# Patient Record
Sex: Male | Born: 1985 | Race: White | Hispanic: No | Marital: Married | Smoking: Current every day smoker
Health system: Southern US, Community
[De-identification: ages and names within clinical notes are randomized; demographics above are authoritative.]

---

## 2003-01-05 ENCOUNTER — Ambulatory Visit (HOSPITAL_COMMUNITY): Admission: RE | Admit: 2003-01-05 | Discharge: 2003-01-05 | Payer: Self-pay | Admitting: Internal Medicine

## 2003-01-22 ENCOUNTER — Encounter: Payer: Self-pay | Admitting: Internal Medicine

## 2003-01-22 ENCOUNTER — Encounter (HOSPITAL_COMMUNITY): Admission: RE | Admit: 2003-01-22 | Discharge: 2003-02-21 | Payer: Self-pay | Admitting: Internal Medicine

## 2003-01-28 ENCOUNTER — Encounter: Payer: Self-pay | Admitting: Internal Medicine

## 2003-02-03 ENCOUNTER — Encounter: Payer: Self-pay | Admitting: Internal Medicine

## 2003-02-16 ENCOUNTER — Ambulatory Visit (HOSPITAL_COMMUNITY): Admission: RE | Admit: 2003-02-16 | Discharge: 2003-02-16 | Payer: Self-pay | Admitting: General Surgery

## 2010-02-22 ENCOUNTER — Ambulatory Visit: Payer: Self-pay | Admitting: Diagnostic Radiology

## 2010-02-22 ENCOUNTER — Emergency Department (HOSPITAL_BASED_OUTPATIENT_CLINIC_OR_DEPARTMENT_OTHER): Admission: EM | Admit: 2010-02-22 | Discharge: 2010-02-23 | Payer: Self-pay | Admitting: Emergency Medicine
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
## 2011-03-03 NOTE — H&P (Signed)
   NAME:  Charles Meadows                           ACCOUNT NO.:  [id number]   MEDICAL RECORD NO.:  [id number]                   PATIENT TYPE:  AMB   LOCATION:  DAY                                  FACILITY:  [facility]   PHYSICIAN:  Dalia Heading, M.D.               DATE OF BIRTH:  1986-09-21   DATE OF ADMISSION:  01/05/2003  DATE OF DISCHARGE:  01/05/2003                                HISTORY & PHYSICAL   CHIEF COMPLAINT:  Chronic cholecystitis.   HISTORY OF PRESENT ILLNESS:  The patient is a 25 year old white male who was  referred for evaluation and treatment of biliary colic secondary to chronic  cholecystitis.  He has been having right upper quadrant abdominal pain with  radiation to the right flank and back, nausea, postprandial symptoms, fatty  food intolerance, indigestion for many months.  No fever, chills, or  jaundice have been noted.  The symptoms seem to be worsening.  An extensive  workup by Dr. Jena Gauss reveals no specific etiology.  His symptoms are  reproducible after hepatobiliary scan and drinking half and half solution.   PAST MEDICAL HISTORY:  As noted above.   PAST SURGICAL HISTORY:  Unremarkable.   CURRENT MEDICATIONS:  1. Protonix.  2. Phenergan p.r.n.   ALLERGIES:  No known drug allergies.   REVIEW OF SYSTEMS:  Noncontributory.   PHYSICAL EXAMINATION:  GENERAL:  The patient is a well-developed, well-  nourished white male in no acute distress.  VITAL SIGNS:  He is afebrile and vital signs are stable.  HEENT:  Reveals no scleral icterus.  LUNGS:  Clear to auscultation with equal breath sounds bilaterally.  HEART:  Reveals regular rate and rhythm without S3, S4, or murmurs.  ABDOMEN:  Soft, nondistended.  He is tender in the right upper quadrant to  palpation.  No hepatosplenomegaly, masses, or hernias are identified.   IMPRESSION:  Biliary colic.  Chronic cholecystitis.    PLAN:  The patient is scheduled for laparoscopic cholecystectomy on Feb 16, 2003.   The risks and benefits of the procedure, including bleeding,  infection, hepatobiliary injury, and the possibility of an open procedure  were fully explained to the patient.  He gave informed consent.                                               Dalia Heading, M.D.    MAJ/MEDQ  D:  02/12/2003  T:  02/12/2003  Job:  [id number]   cc:   Gaynelle Cage, MD  [phone] [address]  [place]  [place] [address]  Fax: [phone]

## 2011-03-03 NOTE — Op Note (Signed)
NAME:  Charles Meadows                           ACCOUNT NO.:  [id number]   MEDICAL RECORD NO.:  [id number]                   PATIENT TYPE:  AMB   LOCATION:  DAY                                  FACILITY:  [facility]   PHYSICIAN:  Dalia Heading, M.D.               DATE OF BIRTH:  1986-06-06   DATE OF PROCEDURE:  DATE OF DISCHARGE:                                 OPERATIVE REPORT   PREOPERATIVE DIAGNOSIS:  Chronic cholecystitis.   POSTOPERATIVE DIAGNOSIS:  Chronic cholecystitis.   PROCEDURE:  Laparoscopic cholecystectomy   SURGEON:  Dalia Heading, M.D.   ANESTHESIA:  General endotracheal   INDICATIONS:  The patient is a 25 year old white male who is referred for  evaluation and treatment of biliary colic secondary to chronic  cholecystitis.  The risks and benefits of the procedure including bleeding,  infection, hepatobiliary injury, and the possibility of an open procedure  were fully explained to the patient, who gave informed consent.   DESCRIPTION OF PROCEDURE:  The patient was placed in the supine position.  After induction of general endotracheal anesthesia, the abdomen was prepped  and draped using the usual sterile technique with Betadine.  Surgical site  confirmation was performed.   A supraumbilical incision was made down to the fascia.  A Veress needle was  introduced into the abdominal cavity and confirmation of placement was done  using the saline drop test.  The abdomen was then insufflated to 16 mmHg  pressure.  An 11-mm trocar was introduced into the abdominal cavity under  direct visualization without difficulty.  The patient was placed then in  reverse Trendelenburg position and an additional 11-mm trocar was placed in  the epigastric region and 5-mm trocars were placed in the right upper  quadrant and right flank regions. The liver was inspected and noted to be  within normal limits.  The gallbladder was retracted superiorly and  laterally.  The dissection was  begun around the infundibulum of the  gallbladder.  The cystic duct was first identified.  Its junction to the  infundibulum fully identified.  Endoclips were placed proximally and  distally on the cystic duct; and the cystic duct was divided.  This was  likewise done on the cystic artery.  The gallbladder was then freed away  from the gallbladder fossa using Bovie electrocautery.  The gallbladder was  delivered through the epigastric trocar site without difficulty.  The  gallbladder fossa was inspected and no abnormal bleeding or bile leakage was  noted.  Surgicel was placed in the gallbladder fossa, the subhepatic space,  as well as the right hepatic gutter were evacuated of all fluid and air  prior to removal of the trocars.   All wounds were irrigated with normal saline.  All wounds were injected with  0.5% Sensorcaine.  The supraumbilical fascia was reapproximated using an #0  Vicryl interrupted suture. All skin incisions  were closed using staples.  Betadine ointment and dry sterile dressings were applied.   All tape and needle counts were correct at the end of the procedure.  The  patient was extubated in the operating room and went back to recovery room  awake and stable condition.   COMPLICATIONS:  None.   SPECIMENS:  Gallbladder.   BLOOD LOSS:  Minimal.                                               Dalia Heading, M.D.    MAJ/MEDQ  D:  02/16/2003  T:  02/16/2003  Job:  [id number]   cc:   R. Roetta Sessions, M.D.  [address]  [place]  [place] [address]  Fax: [phone]   Gaynelle Cage, MD  [phone] [address]  [place]  [place] [address]  Fax: [phone]

## 2011-03-03 NOTE — Consult Note (Signed)
NAME:  Charles Meadows                             ACCOUNT NO.:  [id number]   MEDICAL RECORD NO.:  [id number]                   PATIENT TYPE:  AMB   LOCATION:                                       FACILITY:  [facility]   PHYSICIAN:  R. Roetta Sessions, M.D.              DATE OF BIRTH:  04-14-1986   DATE OF CONSULTATION:  01/02/2003  DATE OF DISCHARGE:                                   CONSULTATION   REASON FOR CONSULTATION:  Nausea, vomiting, and abdominal pain  postprandially.   HISTORY OF PRESENT ILLNESS:  The patient is an 25 year old, Caucasian, male  patient of Dr. Reola Calkins, who presents today for further evaluation of the above-  stated symptoms.  He has had nausea, vomiting, and epigastric pain for two  weeks' duration.  He has been admitted to the hospital on one occasion at  [facility].  Blood work at that time revealed a slightly  elevated white count at 11.6 on admission on December 23, 2002.  The hemoglobin  and hematocrit were normal.  CT of the pelvis was done to rule out  appendicitis.  There was no abnormality seen.  He had an acute abdominal  series, which was normal.  It was felt that abdominal pain, nausea,  vomiting, and diarrhea may be secondary to a viral illness.  The patient has  also been to the emergency department on one other occasion, but was  released.  He had blood work done on December 29, 2002, which revealed a normal  white count of 51, hemoglobin 16.3, hematocrit 50.2, and platelets 185,000.  He had a gallbladder ultrasound on December 30, 2002, which was unremarkable.  LFTs on December 30, 2002, were normal.   The patient states that for the last two weeks he has vomited anything that  he eats or drinks.  He is having epigastric pain after eating.  Prior to the  last couple of weeks he generally had a bowel movement daily.  However, over  the last two weeks, he has only had two to three bowel movements, although  he is eating very little.  He denies any  melena or rectal bleeding.  He has  chronic heartburn as long as he can remember.  He has been having  significant heartburn symptoms, but unfortunately has not been able to keep  his Protonix down.  He denies any dysphagia or odynophagia.  He has used [facility]  Powders chronically for headache.   CURRENT MEDICATIONS:  1. Phenergan 25 mg p.r.n.  2. Protonix 40 mg daily.  3. BC Powders p.r.n.   ALLERGIES:  No known drug allergies.   PAST MEDICAL HISTORY:  Negative for chronic illnesses.   FAMILY HISTORY:  Negative for chronic GI illnesses or colorectal cancer.   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  He  consumes alcohol occasionally.  Denies any tobacco use.   REVIEW OF SYSTEMS:  GASTROINTESTINAL:  Please see the HPI.  GENERAL:  He  states that he has lost a couple of pounds.  CARDIOPULMONARY:  Denies any  chest pain or shortness of breath.   PHYSICAL EXAMINATION:  WEIGHT:  234 pounds.  HEIGHT:  6 feet.  VITAL SIGNS:  Temperature 98.3 degrees, blood pressure 104/72, pulse 70.  GENERAL APPEARANCE:  A pleasant, well-nourished, well-developed, Caucasian  male in no acute distress.  He is accompanied by his mother.  SKIN:  Warm and dry.  No jaundice.  HEENT:  Pupils equal, round, and reactive to light.  The conjunctivae are  pink.  Sclerae nonicteric.  Oropharyngeal mucosa moist and pink.  No  lesions, erythema, or exudates.  NECK:  No lymphadenopathy or thyromegaly.  CHEST:  Lungs clear to auscultation.  CARDIAC:  Regular rate and rhythm.  Normal S1 and S2.  No murmurs, rubs, or  gallops.  ABDOMEN:  Positive bowel sounds.  Soft and nondistended.  He has mild to  moderate tenderness in the epigastric region to deep palpation.  Mild  tenderness just right of the midline in the upper abdomen.  No organomegaly  or masses.  No rebound, tenderness, or guarding.  EXTREMITIES:  No edema.   LABORATORY DATA:  On December 29, 2002, glucose 63, BUN 10,  creatinine 0.9,  total bilirubin 0.5, alkaline phosphatase 65, SGOT 18, SGPT 20, and albumin  4.4.   IMPRESSION:  The patient is an 25 year old gentleman with a two-week history  of postprandial nausea, vomiting, and epigastric pain.  He is keeping very  little food or liquids down.  He clinically does not appear to be  dehydrated.  In addition, his BUN and creatinine recently were normal.  Given chronic BC Powders use, would be concerned about peptic ulcer disease  and partial gastric outlet obstruction.  In addition, he also has chronic  gastrointestinal reflux disease and may be having symptoms partially due to  poorly controlled reflux.  He needs to have an EGD for further evaluation of  these symptoms.   PLAN:  1. EGD in the near future.  2. He will discontinue all NSAID and aspirin use, including BC Powders.  3. Protonix 40 mg p.o. b.i.d., #30 samples given.  4. He will continue Phenergan p.r.n.  I offered suppositories, but he     refused.  5. Further recommendations to follow.   I would like to thank Dr. Reola Calkins for allowing [place] to take part in the care of  this patient.     Tana Coast, Pricilla Larsson, M.D.    LL/MEDQ  D:  01/02/2003  T:  01/02/2003  Job:  [id number]

## 2011-03-03 NOTE — Op Note (Signed)
NAME:  Charles Meadows                           ACCOUNT NO.:  [id number]   MEDICAL RECORD NO.:  [id number]                   PATIENT TYPE:  AMB   LOCATION:  DAY                                  FACILITY:  [facility]   PHYSICIAN:  R. Roetta Sessions, M.D.              DATE OF BIRTH:  02-12-1986   DATE OF PROCEDURE:  01/05/2003  DATE OF DISCHARGE:                                 OPERATIVE REPORT   PROCEDURE:  Esophagogastroduodenoscopy diagnostic.   INDICATIONS FOR PROCEDURE:  The patient is an 25 year old gentleman with  recent post prandial vomiting, epigastric pain and reflux symptoms recently  admitted to [facility]. A CT scan was negative. He had been on  Protonix and NSAID use had been discontinued. Protonix was increased to 40  mg orally b.i.d. three days ago. EGD is now being done to further evaluate  his symptoms. This approach has been discussed with the patient at length  previously and again today at the bedside. The potential risks, benefits,  and alternatives have been reviewed. Please see the dictated H&P and  consultation noted for more information.   MONITORING:  O2 saturation, blood pressure, pulse, and respirations were  monitored throughout the entire procedure.   CONSCIOUS SEDATION:  Versed 6 mg IV, Demerol 100 mg IV in divided doses.  Cetacaine spray for topical oropharyngeal anesthesia.   INSTRUMENTS:  Olympus video chip adult gastroscope.   FINDINGS:  Examination of the tubular esophagus revealed four quadrant  distal esophageal erosions coming up as 3 cm into the distal esophagus.  There was free reflux of gastric juice seen during the exam. The EG junction  was patulous. There was on Barrett's esophagus or other abnormality. Pylorus  easily traversed.   STOMACH:  The gastric cavity was empty and insufflated well with air.  A  thorough examination of the gastric mucosa including a retroflexed view of  the proximal stomach and esophagogastric junction  demonstrated only a small  hiatal hernia.  The pylorus was patent and easily traversed.   DUODENUM:  The bulb and second portion appeared normal.   THERAPEUTIC/DIAGNOSTIC MANEUVERS:  None.   The patient tolerated the procedure well and was reacted in endoscopy.   IMPRESSION:  1. Multiple distal esophageal erosions consistent with mild to moderately     severe erosive reflux esophagitis. Patulous esophagogastric junction.  2. Hiatal hernia. The remainder of the stomach and duodenum through the     second portion appeared normal.  3. The patient has complicated reflux disease.    RECOMMENDATIONS:  1. Antireflux measures/literature provided to [name].  2. Continue Protonix 40 mg orally b.i.d.  3. Will see him back in the office in 3-4 weeks.  [name], M.D.    RMR/MEDQ  D:  01/05/2003  T:  01/05/2003  Job:  [id number]

## 2013-12-06 ENCOUNTER — Emergency Department (HOSPITAL_COMMUNITY): Payer: BC Managed Care – PPO

## 2013-12-06 ENCOUNTER — Emergency Department (HOSPITAL_COMMUNITY)
Admission: EM | Admit: 2013-12-06 | Discharge: 2013-12-06 | Disposition: A | Discharge status: Home / Self Care | Payer: BC Managed Care – PPO | Attending: Emergency Medicine | Admitting: Emergency Medicine

## 2013-12-06 ENCOUNTER — Encounter (HOSPITAL_COMMUNITY): Payer: Self-pay | Admitting: Emergency Medicine

## 2013-12-06 DIAGNOSIS — S41111A Laceration without foreign body of right upper arm, initial encounter: Secondary | ICD-10-CM

## 2013-12-06 DIAGNOSIS — S21209A Unspecified open wound of unspecified back wall of thorax without penetration into thoracic cavity, initial encounter: Secondary | ICD-10-CM | POA: Insufficient documentation

## 2013-12-06 DIAGNOSIS — F17200 Nicotine dependence, unspecified, uncomplicated: Secondary | ICD-10-CM | POA: Insufficient documentation

## 2013-12-06 DIAGNOSIS — S41109A Unspecified open wound of unspecified upper arm, initial encounter: Secondary | ICD-10-CM | POA: Insufficient documentation

## 2013-12-06 DIAGNOSIS — S21219A Laceration without foreign body of unspecified back wall of thorax without penetration into thoracic cavity, initial encounter: Secondary | ICD-10-CM

## 2013-12-06 LAB — SAMPLE TO BLOOD BANK

## 2013-12-06 LAB — COMPREHENSIVE METABOLIC PANEL
ALBUMIN: 3.9 g/dL (ref 3.5–5.2)
ALT: 29 U/L (ref 0–53)
AST: 25 U/L (ref 0–37)
Alkaline Phosphatase: 95 U/L (ref 39–117)
BUN: 14 mg/dL (ref 6–23)
CALCIUM: 9.4 mg/dL (ref 8.4–10.5)
CO2: 25 meq/L (ref 19–32)
CREATININE: 1.02 mg/dL (ref 0.50–1.35)
Chloride: 106 mEq/L (ref 96–112)
GFR calc Af Amer: >90 mL/min (ref >90)
GFR calc non Af Amer: >90 mL/min (ref >90)
Glucose, Bld: 134 mg/dL — ABNORMAL HIGH (ref 70–99)
Potassium: 4.1 mEq/L (ref 3.7–5.3)
SODIUM: 143 meq/L (ref 137–147)
TOTAL PROTEIN: 7.1 g/dL (ref 6.0–8.3)
Total Bilirubin: 0.4 mg/dL (ref 0.3–1.2)

## 2013-12-06 LAB — CBC
HEMATOCRIT: 47.1 % (ref 39.0–52.0)
HEMOGLOBIN: 15.9 g/dL (ref 13.0–17.0)
MCH: 30.3 pg (ref 26.0–34.0)
MCHC: 33.8 g/dL (ref 30.0–36.0)
MCV: 89.9 fL (ref 78.0–100.0)
Platelets: 196 10*3/uL (ref 150–400)
RBC: 5.24 MIL/uL (ref 4.22–5.81)
RDW: 12.7 % (ref 11.5–15.5)
WBC: 9.2 10*3/uL (ref 4.0–10.5)

## 2013-12-06 MED ORDER — OXYCODONE-ACETAMINOPHEN 5-325 MG PO TABS
2.0000 | ORAL_TABLET | Freq: Once | ORAL | Status: AC
  Administered 2013-12-06: 2 via ORAL
  Filled 2013-12-06: qty 2

## 2013-12-06 MED ORDER — HYDROCODONE-ACETAMINOPHEN 5-325 MG PO TABS: 1.0000 | ORAL_TABLET | Freq: Four times a day (QID) | ORAL | Status: AC | PRN
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
## 2013-12-06 NOTE — ED Notes (Signed)
PA at bedside performing suture care. Pt tolerating without difficulty. 

## 2013-12-06 NOTE — Discharge Instructions (Signed)

## 2013-12-06 NOTE — ED Notes (Signed)
EDP at bedside performing suture care. Pt tolerating without difficulty. Vital signs stable. Family remains at bedside.
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
## 2013-12-06 NOTE — ED Notes (Signed)
CSI at bedside.

## 2013-12-06 NOTE — ED Provider Notes (Signed)
Medical screening examination/treatment/procedure(s) were conducted as a shared visit with non-physician practitioner(s) and myself.  I personally evaluated the patient during the encounter.  EKG Interpretation   None         Shanna CiscoMegan E Docherty, MD 12/06/13 [phone]

## 2013-12-06 NOTE — ED Provider Notes (Signed)
LACERATION REPAIR Performed by: Antony MaduraHUMES, Linzee Depaul Authorized by: Antony MaduraHUMES, Ross Bender Consent: Verbal consent obtained. Risks and benefits: risks, benefits and alternatives were discussed Consent given by: patient Patient identity confirmed: provided demographic data Prepped and Draped in normal sterile fashion Wound explored  Laceration Location: proximal R forearm  Laceration Length: 5cm  No Foreign Bodies seen or palpated  Anesthesia: local infiltration  Local anesthetic: lidocaine 2% with epinephrine  Anesthetic total: 4.5 ml  Irrigation method: syringe Amount of cleaning: standard  Skin closure: 4-0 chromic  Number of sutures: 3  Technique: subcutaneous  Patient tolerance: Patient tolerated the procedure well with no immediate complications.  LACERATION REPAIR Performed by: Antony MaduraHUMES, Simuel Stebner Authorized by: Antony MaduraHUMES, Lileigh Fahringer Consent: Verbal consent obtained. Risks and benefits: risks, benefits and alternatives were discussed Consent given by: patient Patient identity confirmed: provided demographic data Prepped and Draped in normal sterile fashion Wound explored  Laceration Location: proximal R forearm  Laceration Length: 5cm  No Foreign Bodies seen or palpated  Anesthesia: local infiltration  Local anesthetic: lidocaine 2% with epinephrine  Anesthetic total: 4.5 ml  Irrigation method: syringe Amount of cleaning: standard  Skin closure: 5-0 prolene  Number of sutures: 7 sutures  Technique: 3 horizontal mattress; 4 simple interrupted  Patient tolerance: Patient tolerated the procedure well with no immediate complications.  LACERATION REPAIR Performed by: Antony MaduraHUMES, Crystalyn Delia Authorized by: Antony MaduraHUMES, Sandeep Radell Consent: Verbal consent obtained. Risks and benefits: risks, benefits and alternatives were discussed Consent given by: patient Patient identity confirmed: provided demographic data Prepped and Draped in normal sterile fashion Wound explored  Laceration Location: distal  R forearm  Laceration Length: 6cm  No Foreign Bodies seen or palpated  Anesthesia: local infiltration  Local anesthetic: lidocaine 2% with epinephrine  Anesthetic total: 4.5 ml  Irrigation method: syringe Amount of cleaning: standard  Skin closure: 4-0 chromic  Number of sutures: 3  Technique: subcutaneous  Patient tolerance: Patient tolerated the procedure well with no immediate complications.  LACERATION REPAIR Performed by: Antony MaduraHUMES, Romello Hoehn Authorized by: Antony MaduraHUMES, Kerensa Nicklas Consent: Verbal consent obtained. Risks and benefits: risks, benefits and alternatives were discussed Consent given by: patient Patient identity confirmed: provided demographic data Prepped and Draped in normal sterile fashion Wound explored  Laceration Location: distal R forearm  Laceration Length: 6cm  No Foreign Bodies seen or palpated  Anesthesia: local infiltration  Local anesthetic: lidocaine 2% with epinephrine  Anesthetic total: 4.5 ml  Irrigation method: syringe Amount of cleaning: standard  Skin closure: 5-0 prolene  Number of sutures: 9 sutures  Technique: 5 horizontal mattress; 4 simple interrupted  Patient tolerance: Patient tolerated the procedure well with no immediate complications.   Antony MaduraKelly Cadence Haslam, PA-C 12/06/13 [phone]

## 2013-12-06 NOTE — ED Notes (Signed)
EDP at bedside discussing plan of care with patient and family. Pt remains alert and oriented x4.
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
## 2016-01-27 DIAGNOSIS — H6982 Other specified disorders of Eustachian tube, left ear: Secondary | ICD-10-CM | POA: Diagnosis not present

## 2016-07-25 DIAGNOSIS — N50811 Right testicular pain: Secondary | ICD-10-CM | POA: Diagnosis not present

## 2016-07-25 DIAGNOSIS — N433 Hydrocele, unspecified: Secondary | ICD-10-CM | POA: Diagnosis not present

## 2016-08-22 DIAGNOSIS — Z1389 Encounter for screening for other disorder: Secondary | ICD-10-CM | POA: Diagnosis not present

## 2016-08-22 DIAGNOSIS — Z720 Tobacco use: Secondary | ICD-10-CM | POA: Diagnosis not present

## 2016-08-22 DIAGNOSIS — Z0000 Encounter for general adult medical examination without abnormal findings: Secondary | ICD-10-CM | POA: Diagnosis not present

## 2017-08-29 DIAGNOSIS — Z1331 Encounter for screening for depression: Secondary | ICD-10-CM | POA: Diagnosis not present

## 2017-08-29 DIAGNOSIS — Z0000 Encounter for general adult medical examination without abnormal findings: Secondary | ICD-10-CM | POA: Diagnosis not present

## 2017-08-29 DIAGNOSIS — Z6841 Body Mass Index (BMI) 40.0 and over, adult: Secondary | ICD-10-CM | POA: Diagnosis not present

## 2017-08-29 DIAGNOSIS — Z1339 Encounter for screening examination for other mental health and behavioral disorders: Secondary | ICD-10-CM | POA: Diagnosis not present

## 2018-01-03 DIAGNOSIS — J209 Acute bronchitis, unspecified: Secondary | ICD-10-CM | POA: Diagnosis not present

## 2018-01-03 DIAGNOSIS — J0100 Acute maxillary sinusitis, unspecified: Secondary | ICD-10-CM | POA: Diagnosis not present

## 2018-01-03 DIAGNOSIS — R509 Fever, unspecified: Secondary | ICD-10-CM | POA: Diagnosis not present

## 2018-01-03 DIAGNOSIS — J111 Influenza due to unidentified influenza virus with other respiratory manifestations: Secondary | ICD-10-CM | POA: Diagnosis not present

## 2018-03-28 DIAGNOSIS — L720 Epidermal cyst: Secondary | ICD-10-CM | POA: Diagnosis not present

## 2018-04-08 DIAGNOSIS — L720 Epidermal cyst: Secondary | ICD-10-CM | POA: Diagnosis not present

## 2018-07-22 DIAGNOSIS — M9902 Segmental and somatic dysfunction of thoracic region: Secondary | ICD-10-CM | POA: Diagnosis not present

## 2018-07-22 DIAGNOSIS — M546 Pain in thoracic spine: Secondary | ICD-10-CM | POA: Diagnosis not present

## 2018-07-23 DIAGNOSIS — Z13220 Encounter for screening for lipoid disorders: Secondary | ICD-10-CM | POA: Diagnosis not present

## 2018-07-23 DIAGNOSIS — Z6837 Body mass index (BMI) 37.0-37.9, adult: Secondary | ICD-10-CM | POA: Diagnosis not present

## 2018-07-23 DIAGNOSIS — Z0000 Encounter for general adult medical examination without abnormal findings: Secondary | ICD-10-CM | POA: Diagnosis not present

## 2018-08-26 DIAGNOSIS — M9902 Segmental and somatic dysfunction of thoracic region: Secondary | ICD-10-CM | POA: Diagnosis not present

## 2018-08-26 DIAGNOSIS — M546 Pain in thoracic spine: Secondary | ICD-10-CM | POA: Diagnosis not present

## 2018-08-29 DIAGNOSIS — M9902 Segmental and somatic dysfunction of thoracic region: Secondary | ICD-10-CM | POA: Diagnosis not present

## 2018-08-29 DIAGNOSIS — M546 Pain in thoracic spine: Secondary | ICD-10-CM | POA: Diagnosis not present

## 2018-12-25 DIAGNOSIS — J0110 Acute frontal sinusitis, unspecified: Secondary | ICD-10-CM | POA: Diagnosis not present

## 2019-02-26 DIAGNOSIS — Z03818 Encounter for observation for suspected exposure to other biological agents ruled out: Secondary | ICD-10-CM | POA: Diagnosis not present

## 2019-08-05 DIAGNOSIS — Z6832 Body mass index (BMI) 32.0-32.9, adult: Secondary | ICD-10-CM | POA: Diagnosis not present

## 2019-08-05 DIAGNOSIS — Z79899 Other long term (current) drug therapy: Secondary | ICD-10-CM | POA: Diagnosis not present

## 2019-08-05 DIAGNOSIS — Z1331 Encounter for screening for depression: Secondary | ICD-10-CM | POA: Diagnosis not present

## 2019-08-05 DIAGNOSIS — Z0000 Encounter for general adult medical examination without abnormal findings: Secondary | ICD-10-CM | POA: Diagnosis not present

## 2019-08-05 DIAGNOSIS — Z23 Encounter for immunization: Secondary | ICD-10-CM | POA: Diagnosis not present

## 2019-11-12 DIAGNOSIS — Z20828 Contact with and (suspected) exposure to other viral communicable diseases: Secondary | ICD-10-CM | POA: Diagnosis not present

## 2019-11-12 DIAGNOSIS — J00 Acute nasopharyngitis [common cold]: Secondary | ICD-10-CM | POA: Diagnosis not present

## 2019-11-17 DIAGNOSIS — Z20828 Contact with and (suspected) exposure to other viral communicable diseases: Secondary | ICD-10-CM | POA: Diagnosis not present

## 2019-11-17 DIAGNOSIS — R430 Anosmia: Secondary | ICD-10-CM | POA: Diagnosis not present

## 2020-06-07 DIAGNOSIS — Z20828 Contact with and (suspected) exposure to other viral communicable diseases: Secondary | ICD-10-CM | POA: Diagnosis not present

## 2020-06-07 DIAGNOSIS — R430 Anosmia: Secondary | ICD-10-CM | POA: Diagnosis not present

## 2020-08-12 DIAGNOSIS — Z13220 Encounter for screening for lipoid disorders: Secondary | ICD-10-CM | POA: Diagnosis not present

## 2020-08-12 DIAGNOSIS — Z23 Encounter for immunization: Secondary | ICD-10-CM | POA: Diagnosis not present

## 2020-08-12 DIAGNOSIS — Z0000 Encounter for general adult medical examination without abnormal findings: Secondary | ICD-10-CM | POA: Diagnosis not present

## 2020-08-12 DIAGNOSIS — Z6839 Body mass index (BMI) 39.0-39.9, adult: Secondary | ICD-10-CM | POA: Diagnosis not present

## 2020-08-12 DIAGNOSIS — Z1331 Encounter for screening for depression: Secondary | ICD-10-CM | POA: Diagnosis not present

## 2020-11-30 ENCOUNTER — Other Ambulatory Visit: Payer: Self-pay | Admitting: Family Medicine

## 2020-11-30 DIAGNOSIS — S8001XA Contusion of right knee, initial encounter: Secondary | ICD-10-CM

## 2020-12-20 ENCOUNTER — Ambulatory Visit
Admission: RE | Admit: 2020-12-20 | Discharge: 2020-12-20 | Disposition: A | Discharge status: Home / Self Care | Payer: Self-pay | Source: Ambulatory Visit | Attending: *Deleted | Admitting: *Deleted

## 2020-12-20 ENCOUNTER — Other Ambulatory Visit: Payer: Self-pay

## 2020-12-20 DIAGNOSIS — S8001XA Contusion of right knee, initial encounter: Secondary | ICD-10-CM
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
## 2022-02-21 ENCOUNTER — Other Ambulatory Visit: Payer: Self-pay | Admitting: Orthopedic Surgery

## 2022-02-21 DIAGNOSIS — M542 Cervicalgia: Secondary | ICD-10-CM

## 2022-03-05 ENCOUNTER — Ambulatory Visit
Admission: RE | Admit: 2022-03-05 | Discharge: 2022-03-05 | Disposition: A | Discharge status: Home / Self Care | Payer: Commercial Managed Care - PPO | Source: Ambulatory Visit | Attending: Orthopedic Surgery | Admitting: Orthopedic Surgery

## 2022-03-05 DIAGNOSIS — M542 Cervicalgia: Secondary | ICD-10-CM

## 2022-03-05 IMAGING — MR MR CERVICAL SPINE W/O CM
5 series · 29 of 48 positions shown · non-contrast
Comparison: None Available.

CLINICAL DATA: Right-sided neck pain.  Right arm pain and numbness.

EXAM:
MRI CERVICAL SPINE WITHOUT CONTRAST
TECHNIQUE: Multiplanar, multisequence MR imaging of the cervical spine was
performed. No intravenous contrast was administered.

[Series 5: T2 · sagittal · 3.0mm · 0.55mm/px · 6 of 16 slices shown (1 of 2)]
[im 1/16]
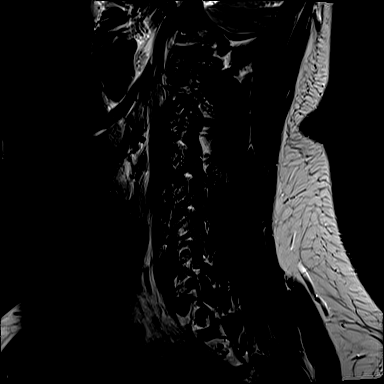
[im 4/16]
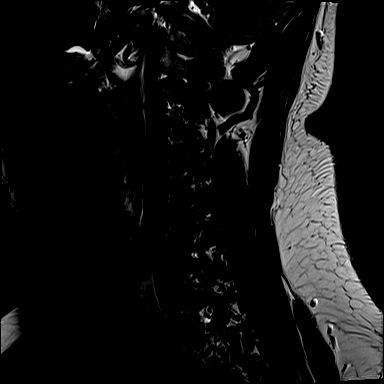
[im 7/16]
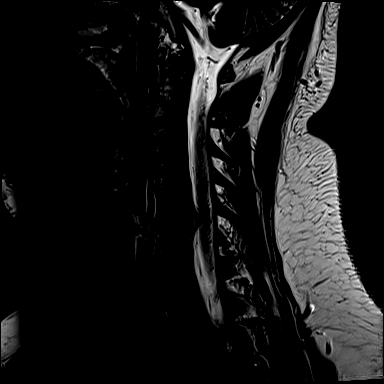
[im 10/16]
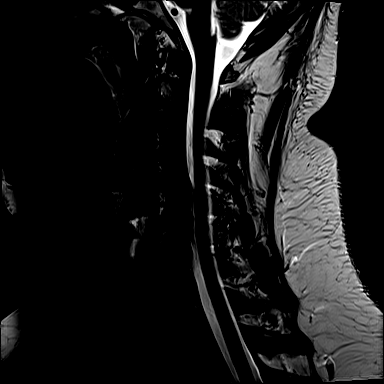
[im 13/16]
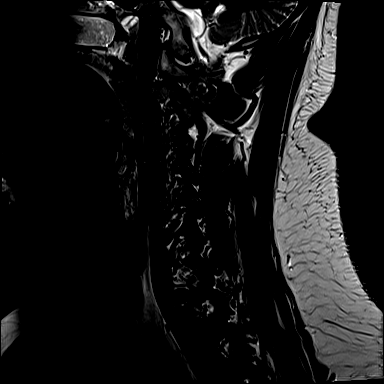
[im 16/16]
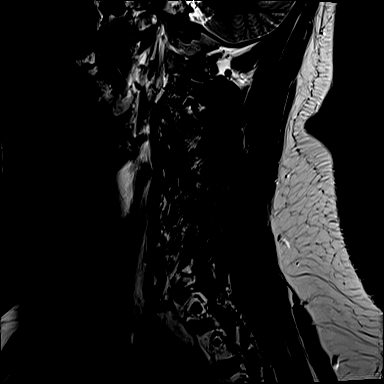

[Series 7: T1 · sagittal · 3.0mm · 0.66mm/px · 6 of 16 slices shown]
[im 1/16]
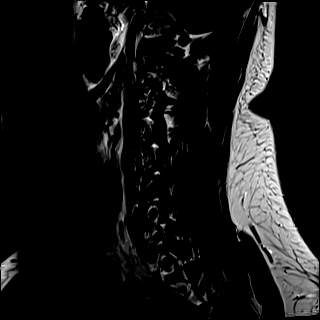
[im 4/16]
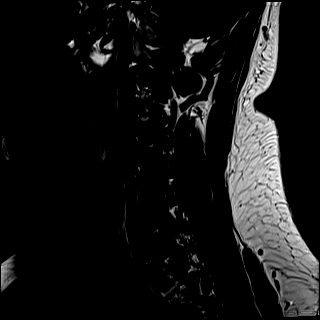
[im 7/16]
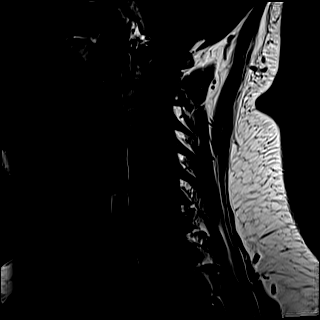
[im 10/16]
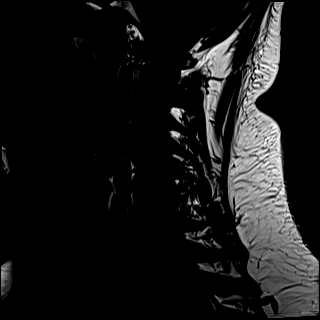
[im 13/16]
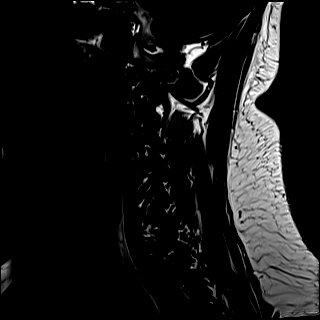
[im 16/16]
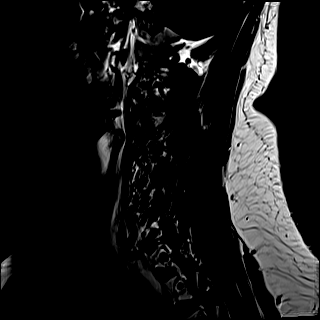

[Series 8: STIR · sagittal · 3.0mm · 0.33mm/px · 6 of 16 slices shown]
[im 1/16]
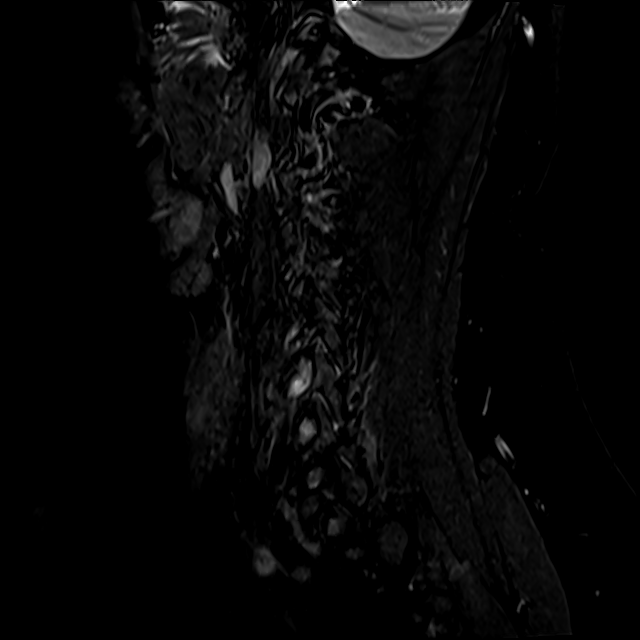
[im 4/16]
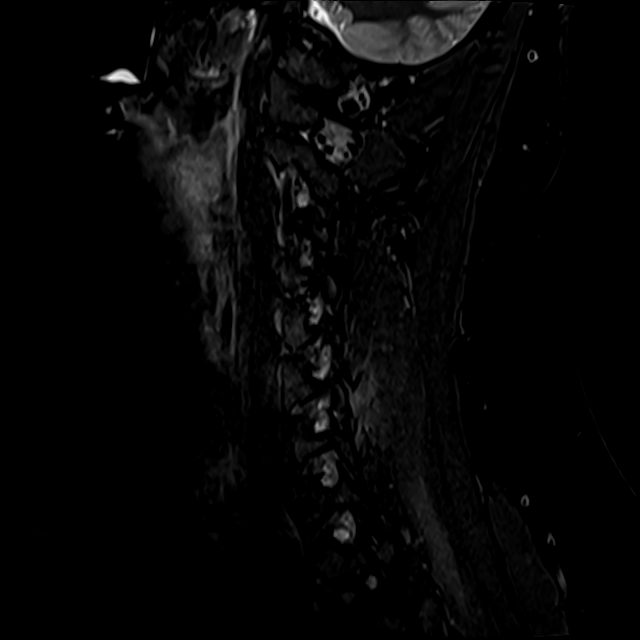
[im 7/16]
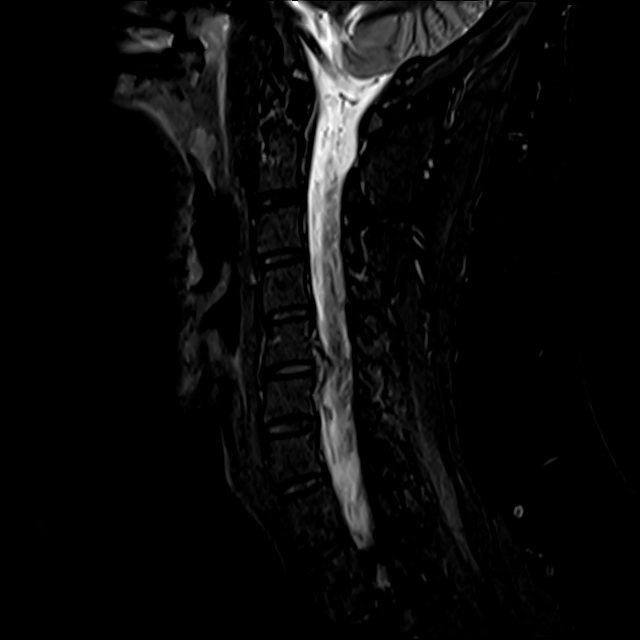
[im 10/16]
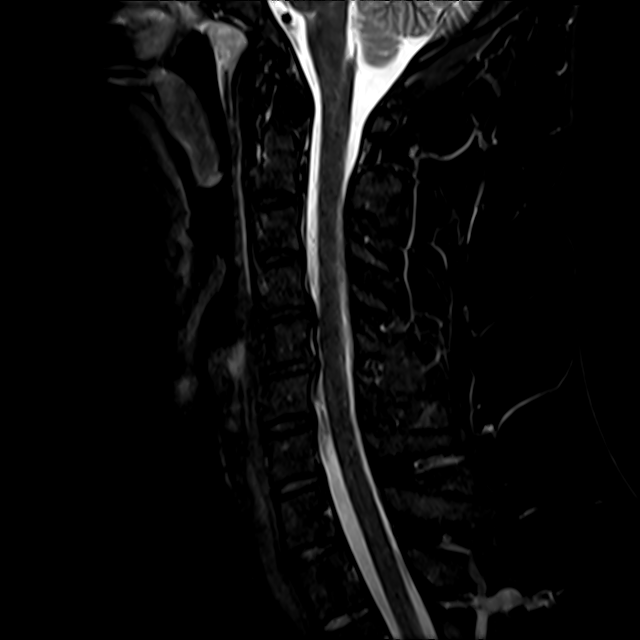
[im 13/16]
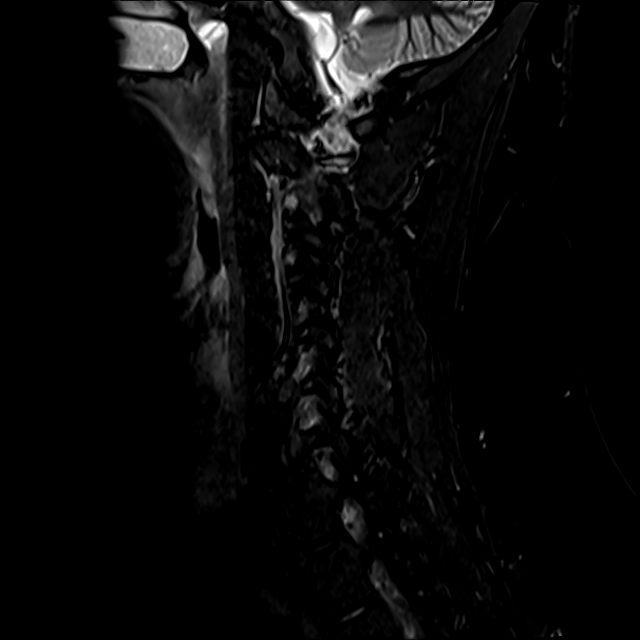
[im 16/16]
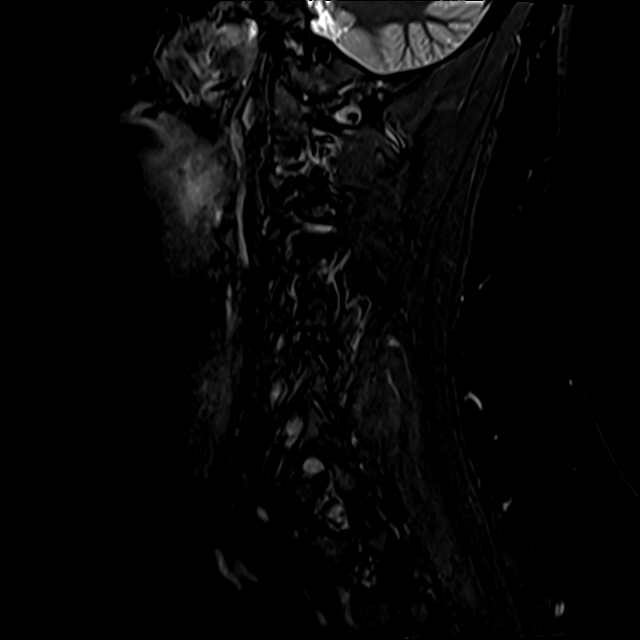

[Series 9: T2 · axial · 3.0mm · 0.56mm/px · z∈[-52,+82]mm · 9 of 42 slices shown (2 of 2)]
[im 1/42]
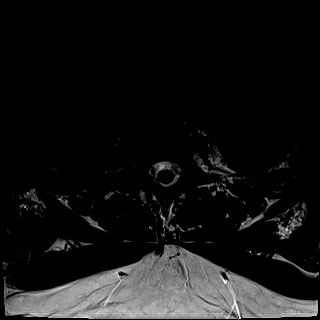
[im 6/42]
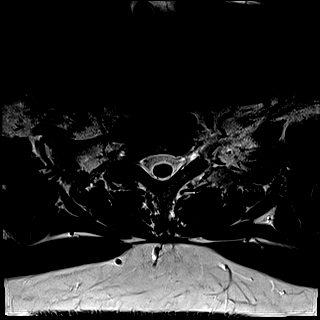
[im 12/42]
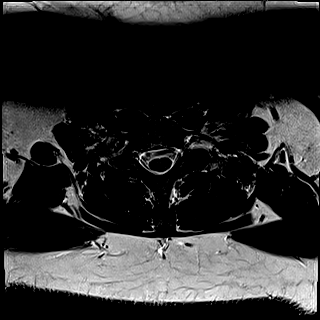
[im 18/42]
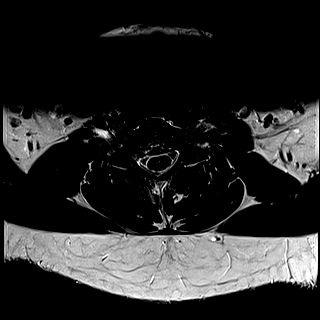
[im 21/42]
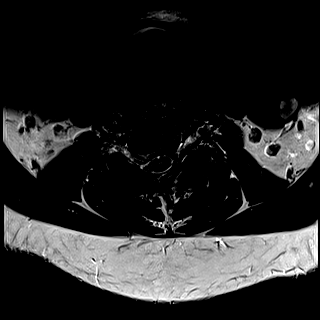
[im 24/42]
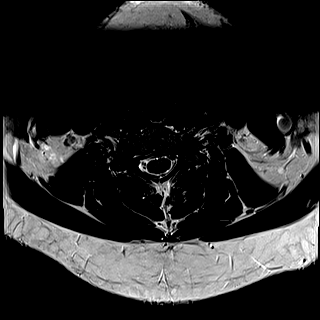
[im 30/42]
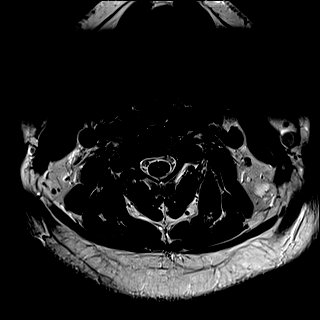
[im 36/42]
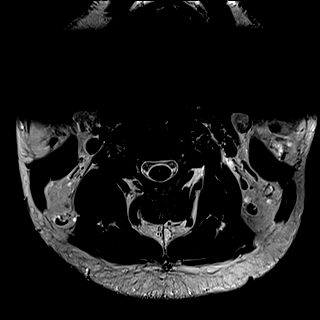
[im 42/42]
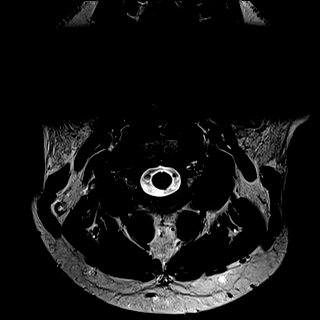

[Series 10: GRE · axial · 3.0mm · 0.47mm/px · z∈[-52,-35]mm · 2 of 42 slices shown]
[im 1/42]
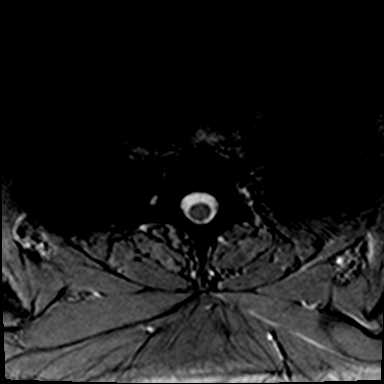
[im 6/42]
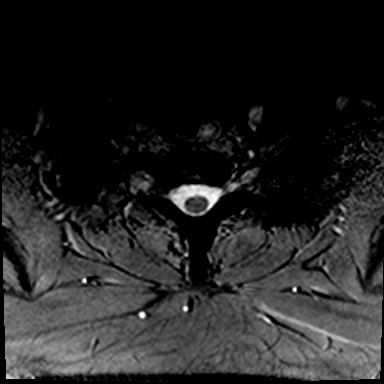

[29 of 48 positions shown; findings below may reference images not displayed]

FINDINGS: Alignment: Straightening of the normal cervical lordosis. No
listhesis.

Vertebrae: No fracture, suspicious marrow lesion, or significant
marrow edema.

Cord: Normal signal and morphology.

Posterior Fossa, vertebral arteries, paraspinal tissues:
Unremarkable.

Disc levels:

C2-3: Negative.

C3-4: Negative.

C4-5: Mild disc bulging and uncovertebral spurring result in mild
left neural foraminal stenosis without spinal stenosis.

C5-6: A large, T2 hyperintense right paracentral to foraminal disc
extrusion results in right-sided spinal stenosis and severe right
neural foraminal stenosis with right C6 nerve root impingement.

C6-7: Negative.

C7-T1: Negative.
IMPRESSION: 1. Large C5-6 disc extrusion with severe right neural foraminal
stenosis and C6 nerve root impingement.
2. Mild left neural foraminal stenosis at C4-5.

## 2022-03-10 ENCOUNTER — Other Ambulatory Visit: Payer: Self-pay | Admitting: Orthopedic Surgery

## 2022-03-10 DIAGNOSIS — M542 Cervicalgia: Secondary | ICD-10-CM

## 2022-06-07 ENCOUNTER — Other Ambulatory Visit: Payer: Self-pay | Admitting: Orthopedic Surgery

## 2022-06-07 DIAGNOSIS — M542 Cervicalgia: Secondary | ICD-10-CM

## 2022-06-11 ENCOUNTER — Ambulatory Visit
Admission: RE | Admit: 2022-06-11 | Discharge: 2022-06-11 | Disposition: A | Discharge status: Home / Self Care | Payer: Commercial Managed Care - PPO | Source: Ambulatory Visit | Attending: Orthopedic Surgery | Admitting: Orthopedic Surgery

## 2022-06-11 DIAGNOSIS — M542 Cervicalgia: Secondary | ICD-10-CM

## 2022-06-20 ENCOUNTER — Other Ambulatory Visit: Payer: Self-pay | Admitting: Orthopedic Surgery

## 2022-06-20 DIAGNOSIS — M542 Cervicalgia: Secondary | ICD-10-CM
